# Patient Record
Sex: Female | Born: 1990 | Smoking: Never smoker
Health system: Southern US, Community
[De-identification: ages and names within clinical notes are randomized; demographics above are authoritative.]

---

## 2013-12-15 ENCOUNTER — Ambulatory Visit: Payer: Medicaid Other | Admitting: Neurology

## 2018-05-30 ENCOUNTER — Encounter (HOSPITAL_COMMUNITY): Payer: Self-pay | Admitting: *Deleted

## 2018-05-30 ENCOUNTER — Emergency Department (HOSPITAL_COMMUNITY): Payer: No Typology Code available for payment source

## 2018-05-30 ENCOUNTER — Emergency Department (HOSPITAL_COMMUNITY)
Admission: EM | Admit: 2018-05-30 | Discharge: 2018-05-30 | Disposition: A | Discharge status: Home / Self Care | Payer: No Typology Code available for payment source | Attending: Emergency Medicine | Admitting: Emergency Medicine

## 2018-05-30 DIAGNOSIS — S39012A Strain of muscle, fascia and tendon of lower back, initial encounter: Secondary | ICD-10-CM | POA: Insufficient documentation

## 2018-05-30 DIAGNOSIS — Y9389 Activity, other specified: Secondary | ICD-10-CM | POA: Diagnosis not present

## 2018-05-30 DIAGNOSIS — Y92410 Unspecified street and highway as the place of occurrence of the external cause: Secondary | ICD-10-CM | POA: Diagnosis not present

## 2018-05-30 DIAGNOSIS — R202 Paresthesia of skin: Secondary | ICD-10-CM | POA: Diagnosis not present

## 2018-05-30 DIAGNOSIS — S161XXA Strain of muscle, fascia and tendon at neck level, initial encounter: Secondary | ICD-10-CM | POA: Insufficient documentation

## 2018-05-30 DIAGNOSIS — Y999 Unspecified external cause status: Secondary | ICD-10-CM | POA: Insufficient documentation

## 2018-05-30 DIAGNOSIS — M542 Cervicalgia: Secondary | ICD-10-CM | POA: Diagnosis present

## 2018-05-30 LAB — POC URINE PREG, ED: Preg Test, Ur: NEGATIVE

## 2018-05-30 MED ORDER — NAPROXEN 500 MG PO TABS: 500.0000 mg | ORAL_TABLET | Freq: Two times a day (BID) | ORAL | 0 refills | Status: AC

## 2018-05-30 MED ORDER — METHOCARBAMOL 500 MG PO TABS: 500.0000 mg | ORAL_TABLET | Freq: Four times a day (QID) | ORAL | 0 refills | Status: AC

## 2018-05-30 MED ORDER — NAPROXEN 250 MG PO TABS
500.0000 mg | ORAL_TABLET | Freq: Once | ORAL | Status: AC
  Administered 2018-05-30: 500 mg via ORAL
  Filled 2018-05-30: qty 2
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
## 2018-05-30 NOTE — ED Notes (Signed)
[employer] at bedside 
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
